# Patient Record
Sex: Male | Born: 1985 | Race: Black or African American | Hispanic: No | Marital: Single | Smoking: Current some day smoker
Health system: Southern US, Community
[De-identification: ages and names within clinical notes are randomized; demographics above are authoritative.]

## PROBLEM LIST (undated history)

## (undated) HISTORY — PX: ANKLE SURGERY: SHX546

---

## 2007-07-04 ENCOUNTER — Emergency Department (HOSPITAL_COMMUNITY): Admission: EM | Admit: 2007-07-04 | Discharge: 2007-07-04 | Payer: Self-pay | Admitting: Emergency Medicine

## 2007-08-26 ENCOUNTER — Emergency Department (HOSPITAL_COMMUNITY): Admission: EM | Admit: 2007-08-26 | Discharge: 2007-08-26 | Payer: Self-pay | Admitting: Emergency Medicine

## 2007-08-27 ENCOUNTER — Ambulatory Visit (HOSPITAL_COMMUNITY): Admission: RE | Admit: 2007-08-27 | Discharge: 2007-08-27 | Payer: Self-pay | Admitting: Emergency Medicine

## 2008-06-09 ENCOUNTER — Emergency Department (HOSPITAL_COMMUNITY): Admission: EM | Admit: 2008-06-09 | Discharge: 2008-06-09 | Payer: Self-pay | Admitting: Emergency Medicine

## 2008-06-17 ENCOUNTER — Emergency Department (HOSPITAL_COMMUNITY): Admission: EM | Admit: 2008-06-17 | Discharge: 2008-06-18 | Payer: Self-pay | Admitting: Emergency Medicine

## 2008-06-26 ENCOUNTER — Emergency Department (HOSPITAL_COMMUNITY): Admission: EM | Admit: 2008-06-26 | Discharge: 2008-06-27 | Payer: Self-pay | Admitting: Emergency Medicine

## 2009-07-31 ENCOUNTER — Emergency Department (HOSPITAL_COMMUNITY): Admission: EM | Admit: 2009-07-31 | Discharge: 2009-07-31 | Payer: Self-pay | Admitting: Emergency Medicine

## 2009-08-27 IMAGING — CT CT HEAD W/O CM
1 series · 16 of 30 positions shown, 20 images · non-contrast
Comparison: None

CLINICAL DATA: Numbness in the left leg.  Headache.

CT HEAD WITHOUT CONTRAST
TECHNIQUE: Contiguous axial images were obtained from the base of
the skull through the vertex without contrast.

[Series 2: headseq 4.8 h37s · axial · 0.44mm/px · z∈[+146,+301]mm · 16 of 36 slices shown, 20 images]
[im 2/36  brain]
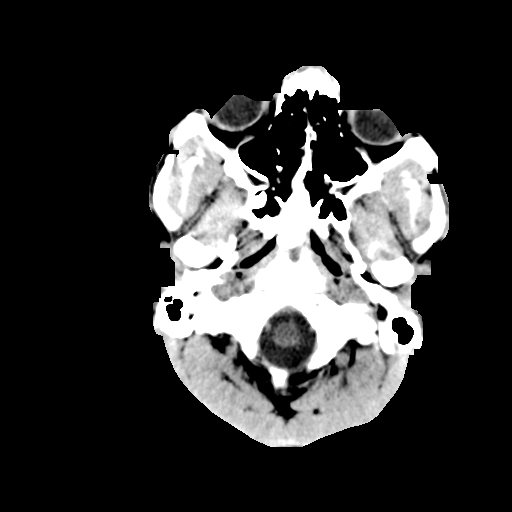
[im 2/36  bone]
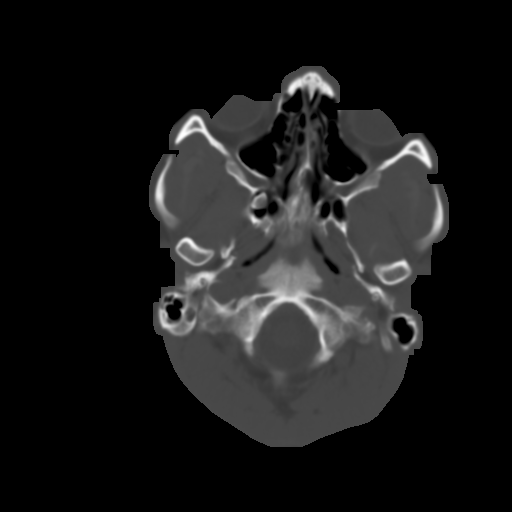
[im 4/36  brain]
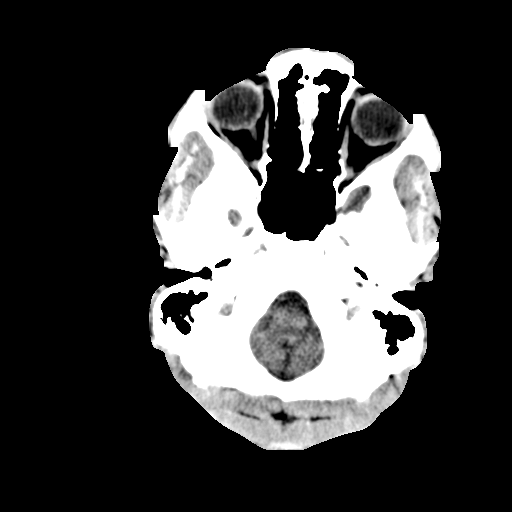
[im 7/36  brain]
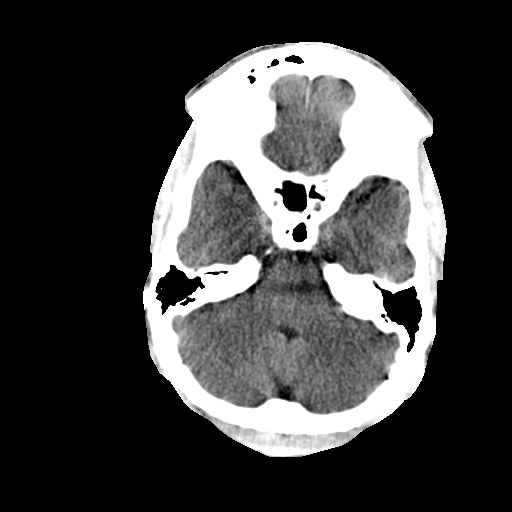
[im 9/36  brain]
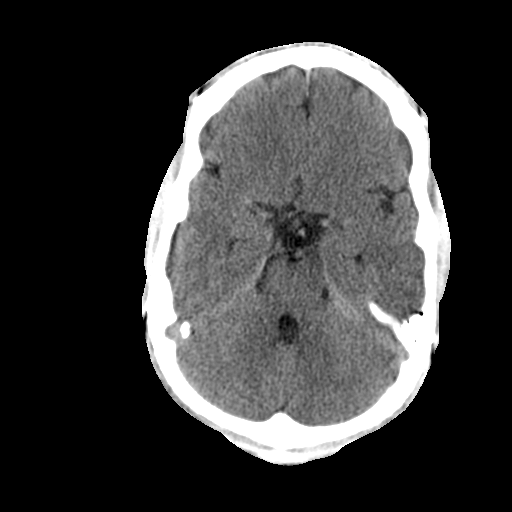
[im 10/36  brain]
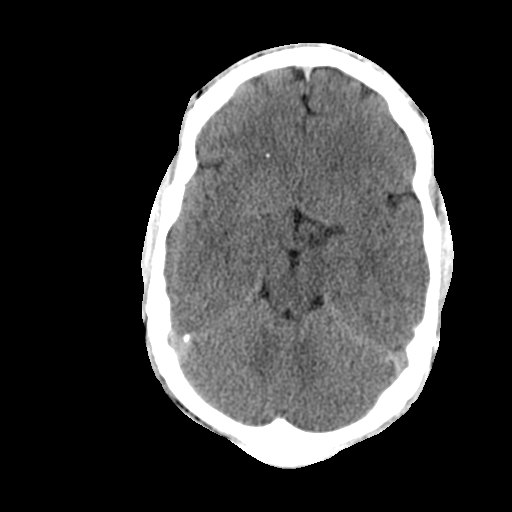
[im 10/36  bone]
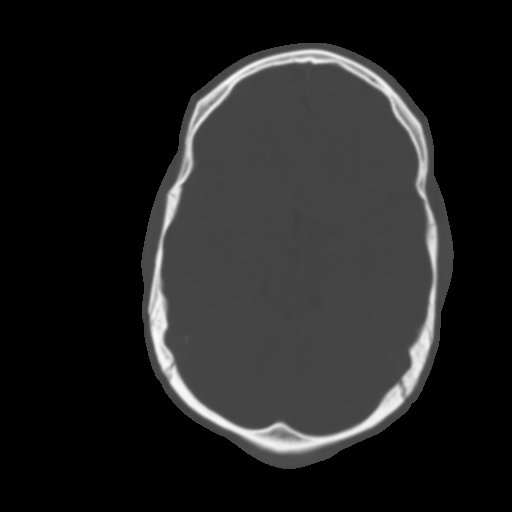
[im 13/36  brain]
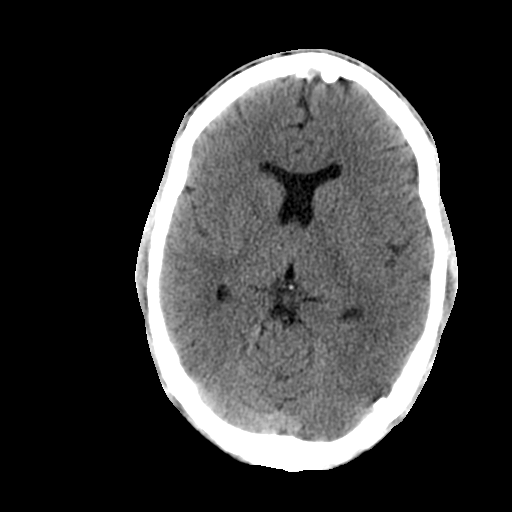
[im 15/36  brain]
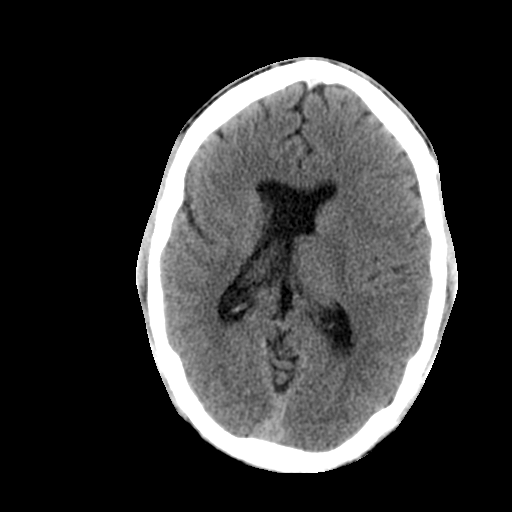
[im 17/36  brain]
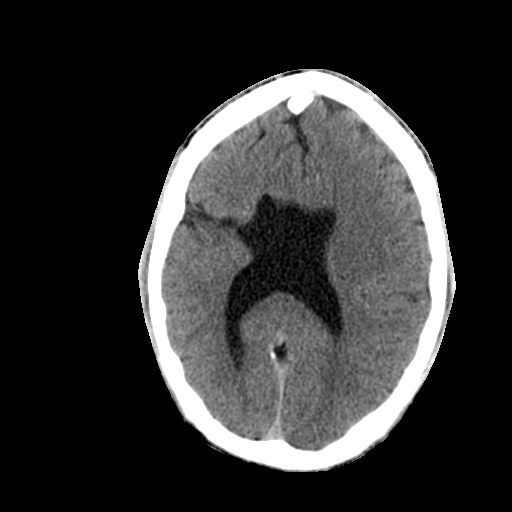
[im 19/36  brain]
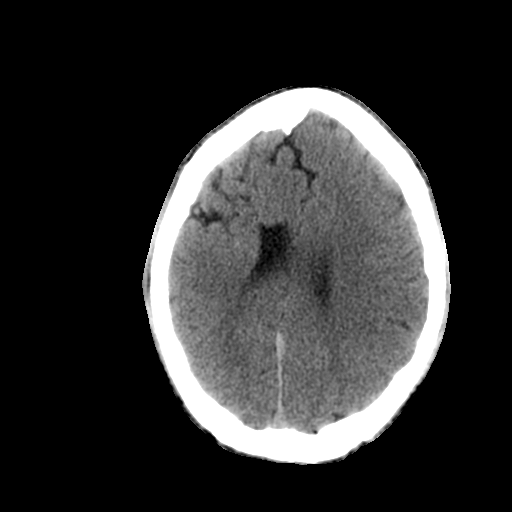
[im 19/36  bone]
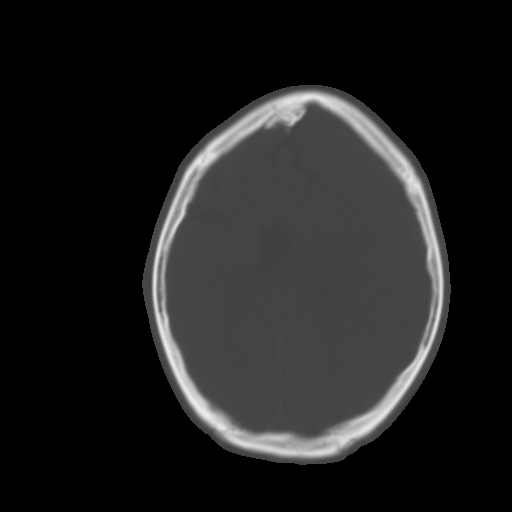
[im 21/36  brain]
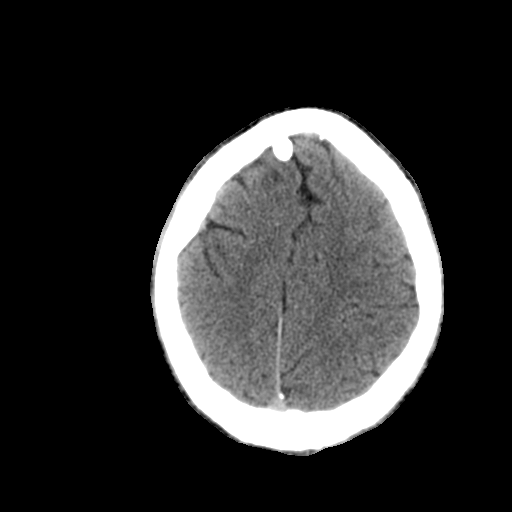
[im 23/36  brain]
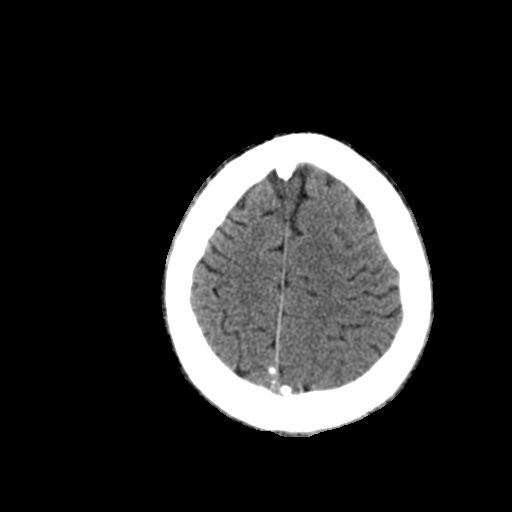
[im 26/36  brain]
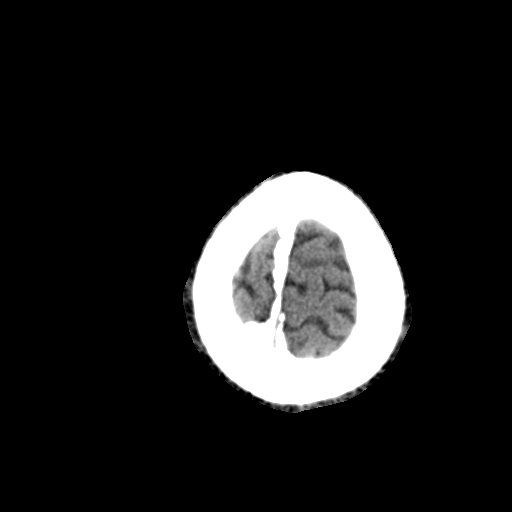
[im 27/36  brain]
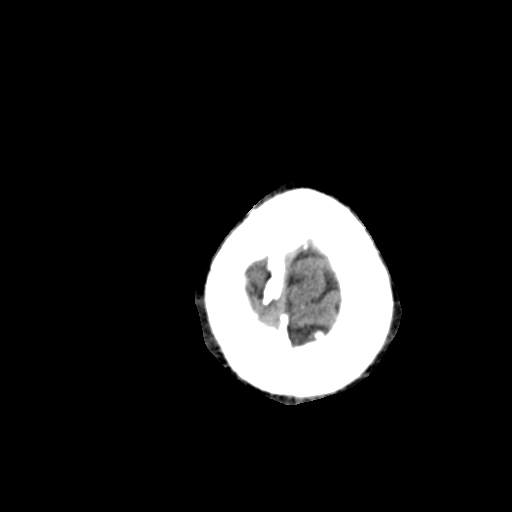
[im 27/36  bone]
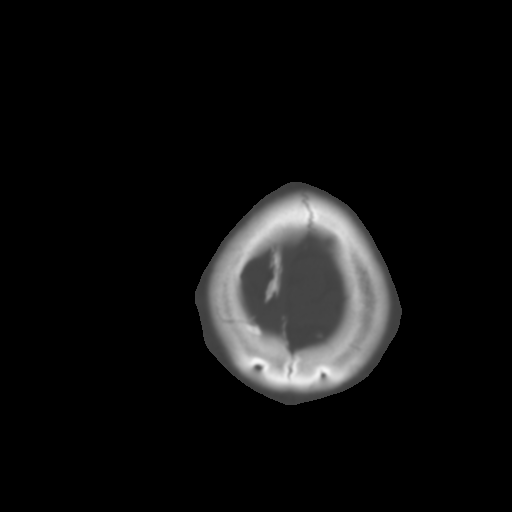
[im 29/36  brain]
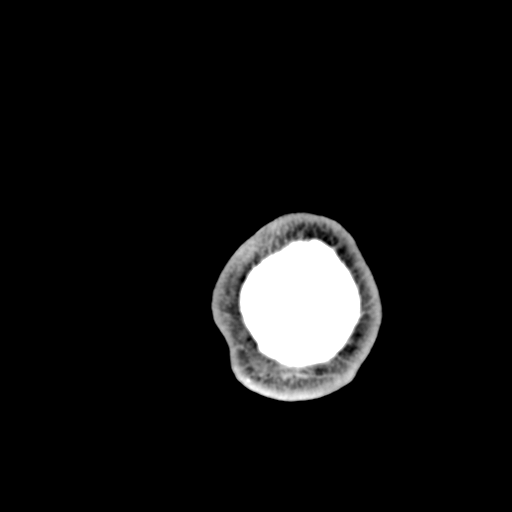
[im 32/36  brain]
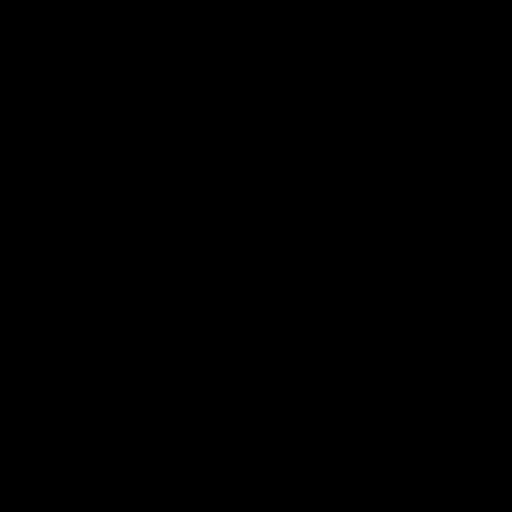
[im 34/36  brain]
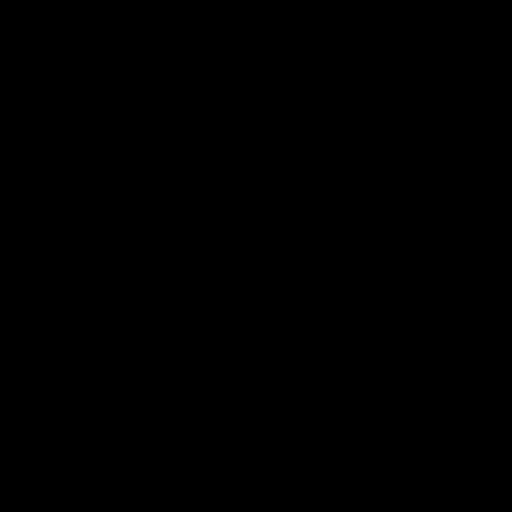

[16 of 30 positions shown; findings below may reference images not displayed]

FINDINGS: There is no evidence of acute infarction, mass lesion,
obstructive hydrocephalus or extra-axial collection.  The patient
has extensive migrational abnormalities on a congenital basis
including absence of the septum pellucidum, absence of the anterior
falx, polymicrogyria in the right frontal region and schizencephaly
in the right frontal region.  No acute calvarial finding.  Sinuses,
middle ears and mastoids are clear.
IMPRESSION: No acute abnormality.  Extensive congenital migrational anomalies
of the right hemisphere.  See above discussion.

## 2009-09-04 IMAGING — CR DG FINGER THUMB 2+V*L*
1 series · 1 of 1 positions shown · non-contrast
Comparison: None

Addendum Begins

The initial report incorrectly states in both the body and
impression of the report than abnormality is seen at the head of
the left first "metatarsal".  The report should be corrected to
state "Possible avulsion fracture at the head of the left first
metacarpal with mild volar subluxation at the first MCP joint."
Addendum Ends
CLINICAL DATA: Left thumb pain, trauma
LEFT THUMB 2+V

[view not recorded]
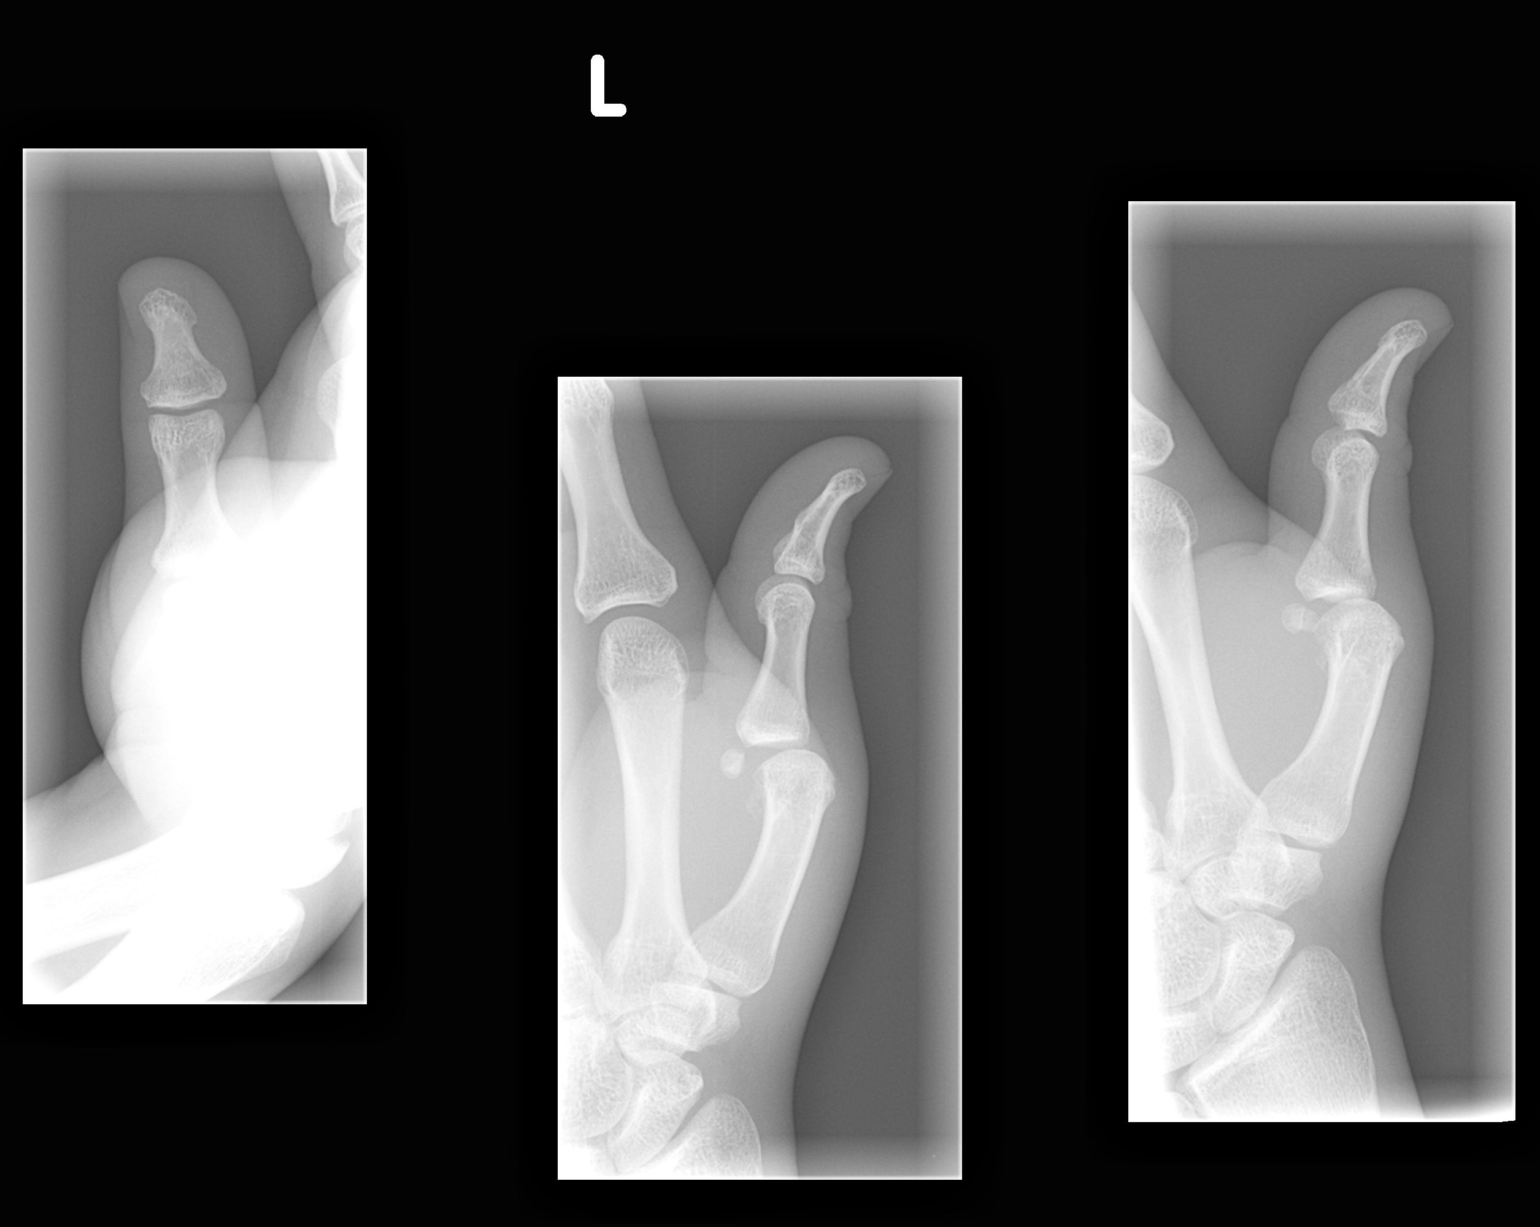

[1 of 1 positions shown; findings below may reference images not displayed]

FINDINGS: There is cortical irregularity at the head of the left
first metatarsal with mild volar subluxation at the metatarsal
phalangeal joint.  The PA view is suboptimal due to overlapping
soft tissue.
IMPRESSION: Possible avulsion fracture at the head of the left first metatarsal
with mild volar subluxation at the metatarsal phalangeal joint.

## 2009-12-11 ENCOUNTER — Emergency Department (HOSPITAL_COMMUNITY): Admission: EM | Admit: 2009-12-11 | Discharge: 2009-12-11 | Payer: Self-pay | Admitting: Emergency Medicine

## 2010-09-04 ENCOUNTER — Emergency Department (HOSPITAL_COMMUNITY): Admission: EM | Admit: 2010-09-04 | Discharge: 2010-09-04 | Payer: Self-pay | Admitting: Emergency Medicine

## 2011-07-17 LAB — CBC
Hemoglobin: 14.1
Platelets: 202
RDW: 12.6
WBC: 6

## 2011-07-17 LAB — DIFFERENTIAL
Basophils Absolute: 0
Lymphocytes Relative: 26
Lymphs Abs: 1.5
Monocytes Absolute: 0.3
Neutro Abs: 4.1
Neutrophils Relative %: 69

## 2011-07-17 LAB — BASIC METABOLIC PANEL
Calcium: 9.4
Potassium: 3.3 — ABNORMAL LOW

## 2012-08-22 ENCOUNTER — Encounter (HOSPITAL_COMMUNITY): Payer: Self-pay | Admitting: Emergency Medicine

## 2012-08-22 ENCOUNTER — Emergency Department (HOSPITAL_COMMUNITY): Payer: Self-pay

## 2012-08-22 ENCOUNTER — Emergency Department (HOSPITAL_COMMUNITY)
Admission: EM | Admit: 2012-08-22 | Discharge: 2012-08-22 | Disposition: A | Discharge status: Home / Self Care | Payer: Self-pay | Attending: Emergency Medicine | Admitting: Emergency Medicine

## 2012-08-22 DIAGNOSIS — F17200 Nicotine dependence, unspecified, uncomplicated: Secondary | ICD-10-CM | POA: Insufficient documentation

## 2012-08-22 DIAGNOSIS — S20229A Contusion of unspecified back wall of thorax, initial encounter: Secondary | ICD-10-CM | POA: Insufficient documentation

## 2012-08-22 DIAGNOSIS — S300XXA Contusion of lower back and pelvis, initial encounter: Secondary | ICD-10-CM

## 2012-08-22 DIAGNOSIS — Y929 Unspecified place or not applicable: Secondary | ICD-10-CM | POA: Insufficient documentation

## 2012-08-22 DIAGNOSIS — Z791 Long term (current) use of non-steroidal anti-inflammatories (NSAID): Secondary | ICD-10-CM | POA: Insufficient documentation

## 2012-08-22 DIAGNOSIS — W108XXA Fall (on) (from) other stairs and steps, initial encounter: Secondary | ICD-10-CM | POA: Insufficient documentation

## 2012-08-22 DIAGNOSIS — Y9301 Activity, walking, marching and hiking: Secondary | ICD-10-CM | POA: Insufficient documentation

## 2012-08-22 MED ORDER — OXYCODONE-ACETAMINOPHEN 5-325 MG PO TABS
1.0000 | ORAL_TABLET | Freq: Once | ORAL | Status: AC
  Administered 2012-08-22: 1 via ORAL
  Filled 2012-08-22: qty 1

## 2012-08-22 MED ORDER — HYDROCODONE-ACETAMINOPHEN 5-325 MG PO TABS: ORAL_TABLET | ORAL | Status: AC

## 2012-08-22 MED ORDER — CYCLOBENZAPRINE HCL 10 MG PO TABS: 10.0000 mg | ORAL_TABLET | Freq: Three times a day (TID) | ORAL | Status: AC | PRN

## 2012-08-22 MED ORDER — NAPROXEN 500 MG PO TABS: 500.0000 mg | ORAL_TABLET | Freq: Two times a day (BID) | ORAL | Status: AC

## 2012-08-22 MED ORDER — METHOCARBAMOL 500 MG PO TABS
1000.0000 mg | ORAL_TABLET | Freq: Once | ORAL | Status: AC
  Administered 2012-08-22: 1000 mg via ORAL
  Filled 2012-08-22: qty 2

## 2012-08-22 NOTE — ED Notes (Signed)
Pt states he fell down the steps last night/early am today. Pt reports it was about 12 steps.

## 2012-08-24 NOTE — ED Provider Notes (Signed)
History     CSN: [id number]  Arrival date & time 08/22/12  4098   First MD Initiated Contact with Patient 08/22/12 1841      Chief Complaint  Patient presents with  . Back Pain    (Consider location/radiation/quality/duration/timing/severity/associated sxs/prior treatment) HPI Comments: Patient c/o pain to his middle and lower back after falling down several steps.  Pain to his back is worse with certain movements and improves with rest.  He denies leg pain, numbness or weakness of the extremities, neck pain, head injury or LOC.    Patient is a 26 y.o. male presenting with back pain. The history is provided by the patient.  Back Pain  This is a new problem. The current episode started 12 to 24 hours ago. The problem occurs constantly. The problem has not changed since onset.The pain is associated with falling. The pain is present in the lumbar spine and thoracic spine. The quality of the pain is described as aching. The pain does not radiate. The pain is mild. The symptoms are aggravated by bending, twisting and certain positions. Pertinent negatives include no chest pain, no fever, no numbness, no headaches, no abdominal pain, no abdominal swelling, no bowel incontinence, no perianal numbness, no bladder incontinence, no dysuria, no pelvic pain, no leg pain, no paresthesias, no paresis, no tingling and no weakness. He has tried nothing for the symptoms. The treatment provided no relief.    History reviewed. No pertinent past medical history.  Past Surgical History  Procedure Date  . Ankle surgery     History reviewed. No pertinent family history.  History  Substance Use Topics  . Smoking status: Current Some Day Smoker    Types: Cigarettes  . Smokeless tobacco: Never Used  . Alcohol Use:       Review of Systems  Constitutional: Negative for fever.  HENT: Negative for neck pain.   Eyes: Negative for visual disturbance.  Respiratory: Negative for shortness of breath.     Cardiovascular: Negative for chest pain.  Gastrointestinal: Negative for nausea, vomiting, abdominal pain, constipation and bowel incontinence.  Genitourinary: Negative for bladder incontinence, dysuria, hematuria, flank pain, decreased urine volume, difficulty urinating and pelvic pain.       No perineal numbness or incontinence of urine or feces  Musculoskeletal: Positive for back pain. Negative for joint swelling and gait problem.  Skin: Negative for rash.  Neurological: Negative for dizziness, tingling, weakness, numbness, headaches and paresthesias.  All other systems reviewed and are negative.    Allergies  Review of patient's allergies indicates no known allergies.  Home Medications   Current Outpatient Rx  Name  Route  Sig  Dispense  Refill  . CYCLOBENZAPRINE HCL 10 MG PO TABS   Oral   Take 1 tablet (10 mg total) by mouth 3 (three) times daily as needed for muscle spasms.   21 tablet   0   . HYDROCODONE-ACETAMINOPHEN 5-325 MG PO TABS      Take one-two tabs po q 4-6 hrs prn pain   20 tablet   0   . NAPROXEN 500 MG PO TABS   Oral   Take 1 tablet (500 mg total) by mouth 2 (two) times daily with a meal.   20 tablet   0     BP 124/78  Pulse 64  Temp 97.6 F (36.4 C) (Oral)  Resp 18  Ht 5\' 5"  (1.651 m)  Wt 140 lb (63.504 kg)  BMI 23.30 kg/m2  SpO2 99%  Physical  Exam  Nursing note and vitals reviewed. Constitutional: He is oriented to person, place, and time. He appears well-developed and well-nourished. No distress.  HENT:  Head: Normocephalic and atraumatic.  Neck: Normal range of motion. Neck supple.  Cardiovascular: Normal rate, regular rhythm and intact distal pulses.   No murmur heard. Pulmonary/Chest: Effort normal and breath sounds normal.  Musculoskeletal: He exhibits tenderness. He exhibits no edema.       Lumbar back: He exhibits tenderness, bony tenderness and pain. He exhibits normal range of motion, no swelling, no deformity, no laceration  and normal pulse.       Back:       ttp of the lower thoracic and lumbar spine and paraspinal muscles.  DP pulses equal and brisk, distal sensation intact,  Neurological: He is alert and oriented to person, place, and time. No cranial nerve deficit or sensory deficit. He exhibits normal muscle tone. Coordination and gait normal.  Reflex Scores:      Patellar reflexes are 2+ on the right side and 2+ on the left side.      Achilles reflexes are 2+ on the right side and 2+ on the left side. Skin: Skin is warm and dry.    ED Course  Procedures (including critical care time)  Labs Reviewed - No data to display Dg Thoracic Spine 2 View  08/22/2012  *RADIOLOGY REPORT*  Clinical Data: Back pain post fall  THORACIC SPINE - 2 VIEW  Comparison: Lumbar spine radiographs - earlier same day  Findings:  There is suboptimal visualization of the cervical thoracic junction secondary to overlying osseous and soft tissue structures.  Normal alignment of the thoracic spine.  No definite anterolisthesis or retrolisthesis.  Thoracic vertebral body heights and intervertebral disc spaces appear preserved.  Limited visualization of the adjacent mediastinum is normal. Regional soft tissues are normal.  IMPRESSION:  No acute findings.   Original Report Authenticated By: Tacey Ruiz, MD    Dg Lumbar Spine Complete  08/22/2012  *RADIOLOGY REPORT*  Clinical Data: Low back pain post fall  LUMBAR SPINE - COMPLETE 4+ VIEW  Comparison: None  Findings:  There are five non-rib bearing lumbar type vertebral bodies with partial lumbarization of S1.  For the purposes of this dictation, lumbar vertebral levels will be labeled L1-L5.  Normal alignment of the lumbar spine.  No anterolisthesis or retrolisthesis.  No pars defects.  Vertebral body heights and intervertebral disc spaces are preserved.  Limited visualization of the bilateral SI joints and hips is normal.  Regional bowel gas pattern and soft tissues are normal.  IMPRESSION:  Normal radiographs of the lumbar spine.   Original Report Authenticated By: Tacey Ruiz, MD      1. Lumbar contusion       MDM    Patient has ttp of the thoracic and lumbar spine and  paraspinal muscles.  No focal neuro deficits on exam.  Ambulates with a steady gait.    Neg x-ray findings.  Likely contusions.  Pt agrees to f/u with his PMD for recheck if not improving  Prescribed: Flexeril norco #20 naprosyn     Ronia Hazelett L. Vinh Sachs, [place] 08/24/12 1547

## 2012-08-25 NOTE — ED Provider Notes (Signed)
Medical screening examination/treatment/procedure(s) were performed by non-physician practitioner and as supervising physician I was immediately available for consultation/collaboration.  Geoffery Lyons, MD 08/25/12 [phone]

## 2013-10-31 IMAGING — CR DG THORACIC SPINE 2V
3 series · 3 of 3 positions shown · non-contrast
Comparison: Lumbar spine radiographs - earlier same day

CLINICAL DATA: Back pain post fall

THORACIC SPINE - 2 VIEW

[view not recorded (1 of 3)]
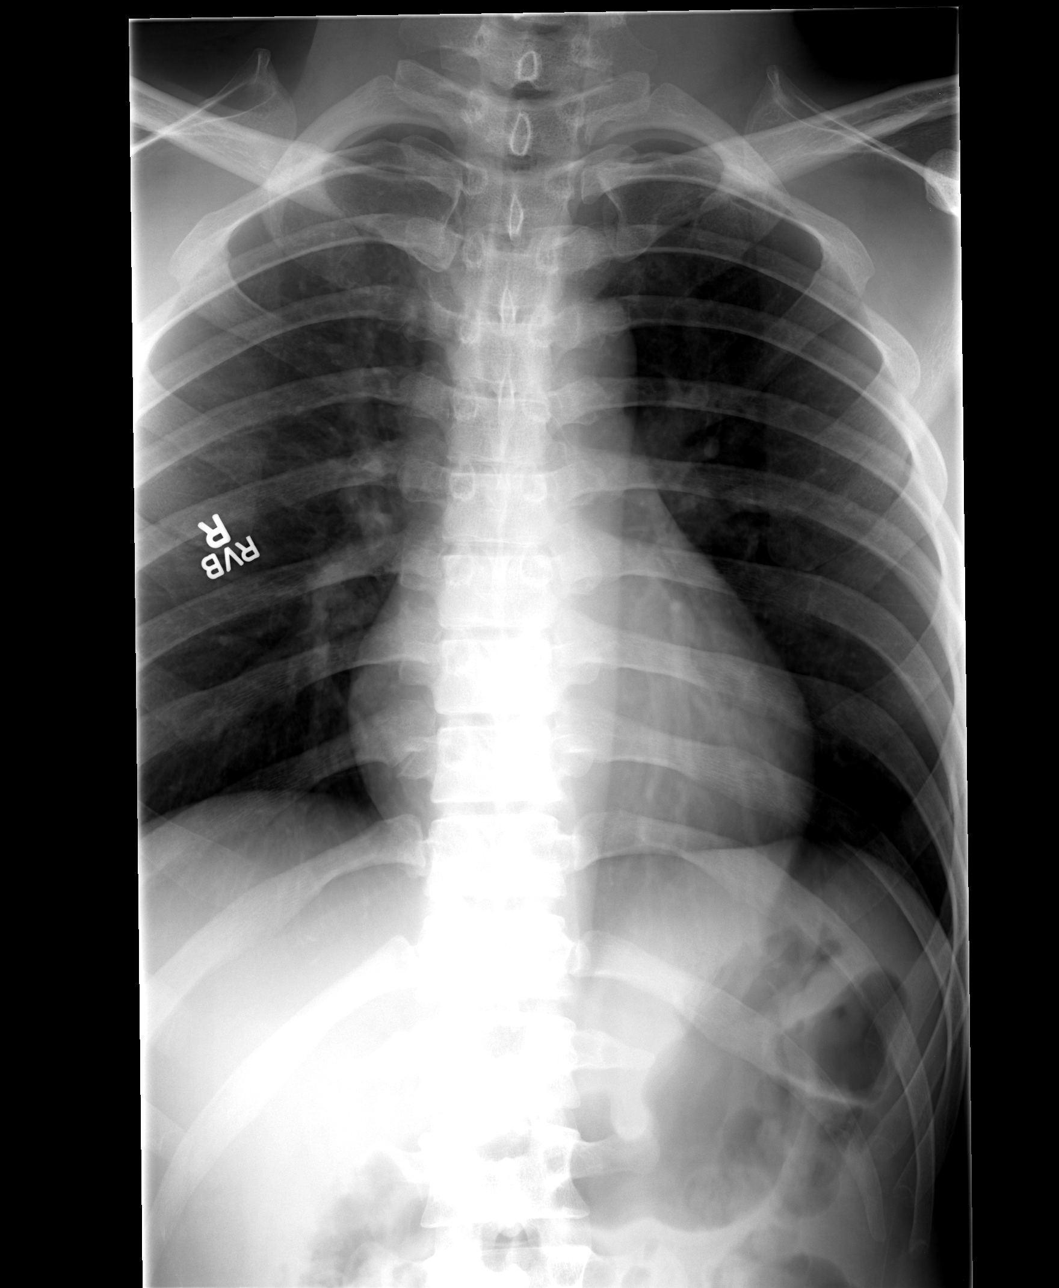

[view not recorded (2 of 3)]
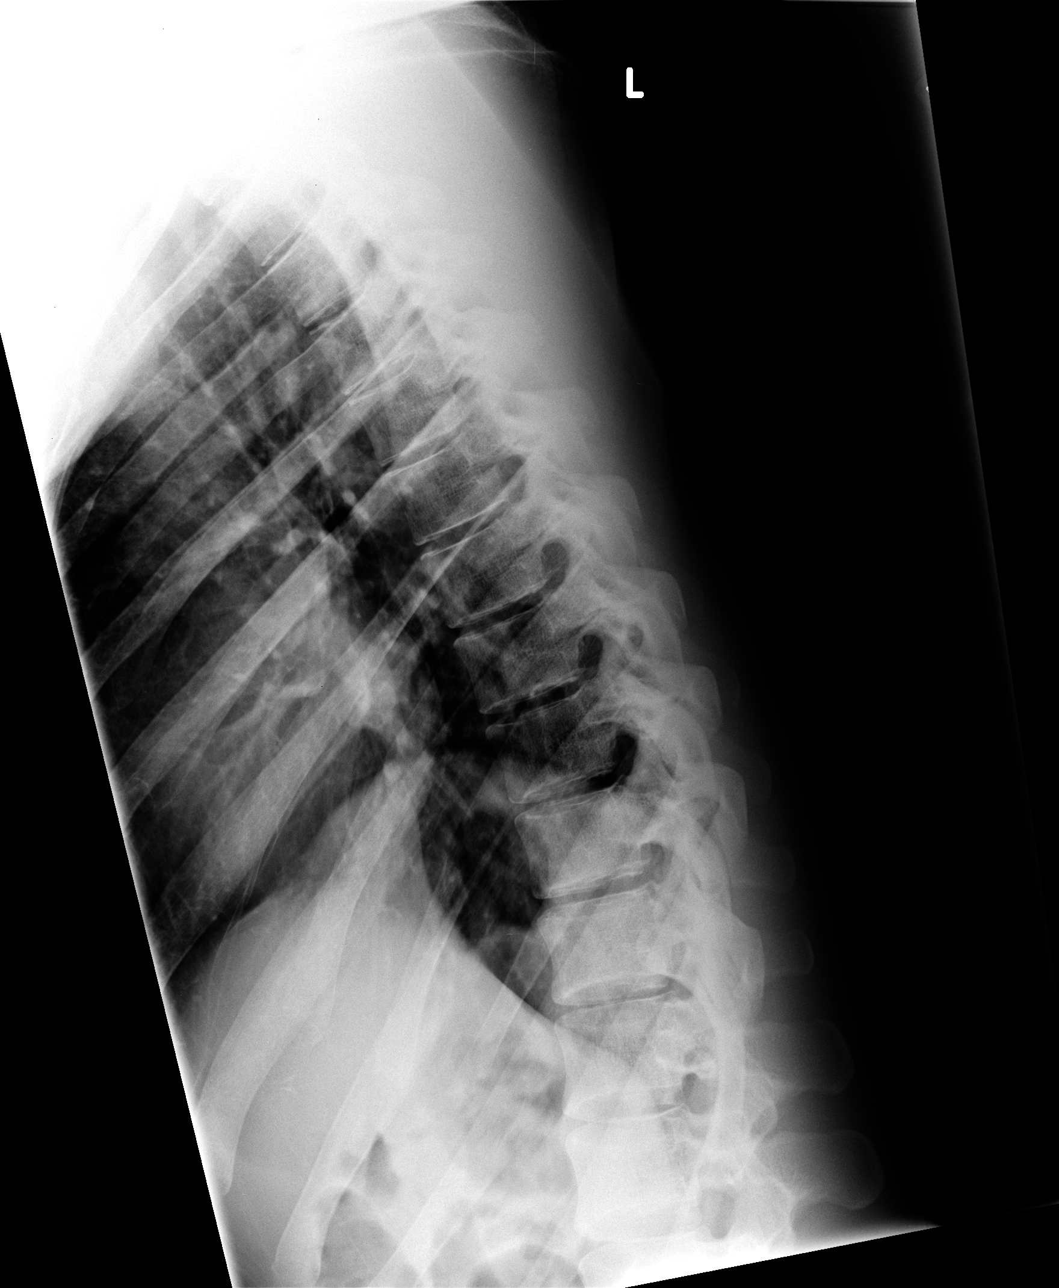

[view not recorded (3 of 3)]
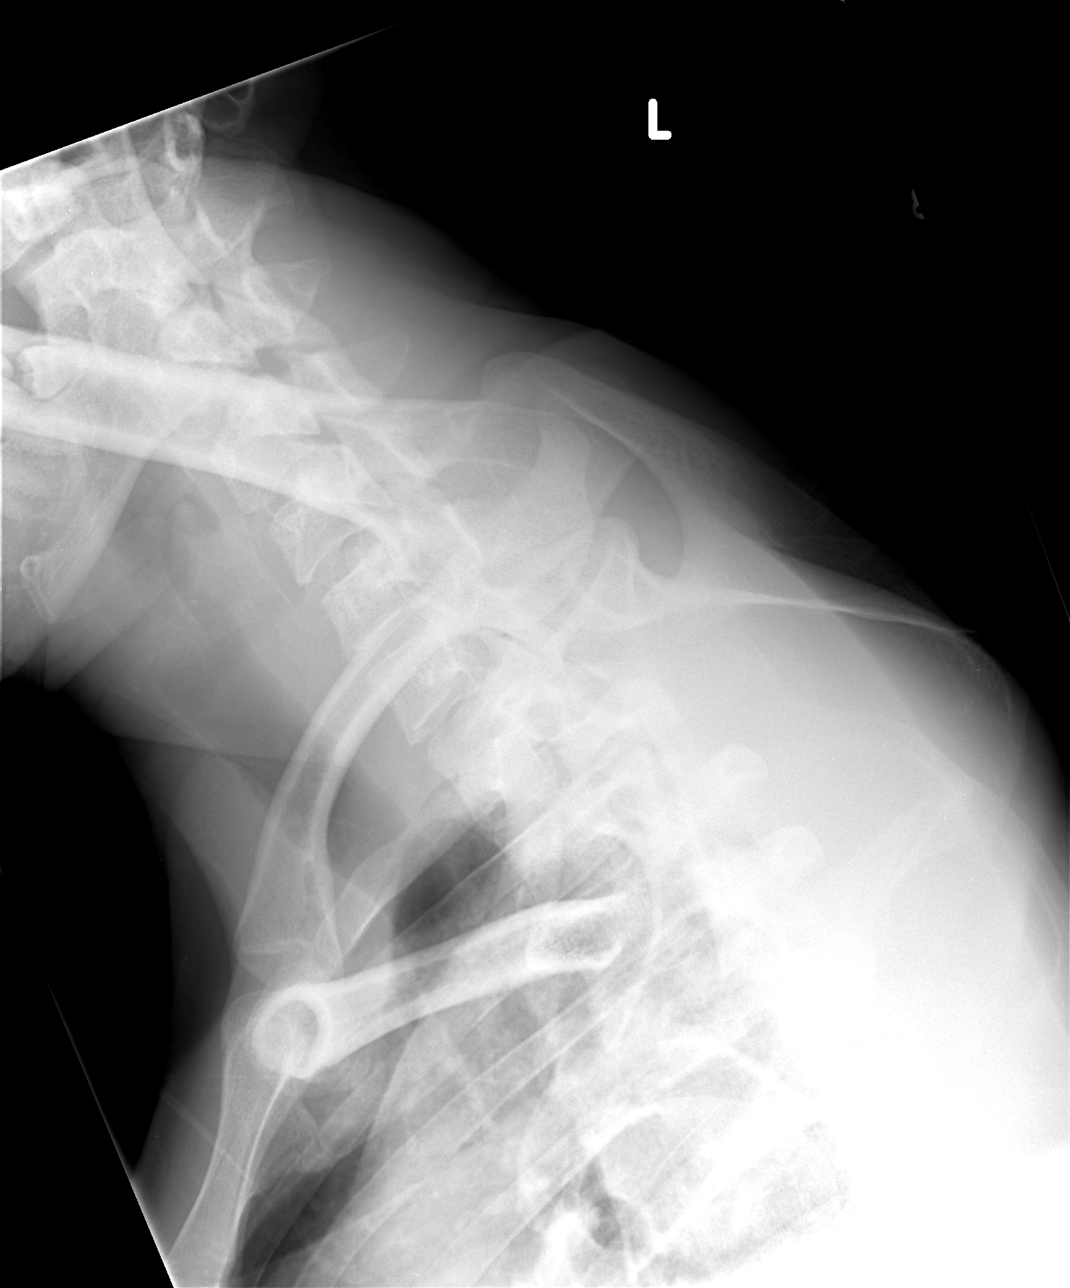

[3 of 3 positions shown; findings below may reference images not displayed]

FINDINGS: There is suboptimal visualization of the cervical thoracic junction
secondary to overlying osseous and soft tissue structures.

Normal alignment of the thoracic spine.  No definite
anterolisthesis or retrolisthesis.

Thoracic vertebral body heights and intervertebral disc spaces
appear preserved.

Limited visualization of the adjacent mediastinum is normal.
Regional soft tissues are normal.
IMPRESSION: No acute findings.

## 2015-11-08 DIAGNOSIS — Z139 Encounter for screening, unspecified: Secondary | ICD-10-CM

## 2016-06-10 NOTE — Congregational Nurse Program (Signed)
Congregational Nurse Program Note  Date of Encounter: 11/08/2015  Past Medical History: No past medical history on file.  Encounter Details: Presented for a follow-up appointment.  Hx of seizures out of medications. Missed previous appointment scheduled for January 24, 2017s., [facility] at 4:00pm by Richardo HanksJanice Jones, RN , Hedda SladePatricia Settle, RN [phone]

## 2023-11-06 ENCOUNTER — Encounter: Payer: Self-pay | Admitting: Neurology

## 2023-11-11 ENCOUNTER — Encounter: Payer: Self-pay | Admitting: Neurology

## 2023-11-11 ENCOUNTER — Ambulatory Visit: Payer: Medicaid Other | Admitting: Neurology
